# Patient Record
Sex: Male | Born: 1960 | Race: White | Hispanic: No | Marital: Married | Smoking: Current every day smoker
Health system: Southern US, Community
[De-identification: ages and names within clinical notes are randomized; demographics above are authoritative.]

## PROBLEM LIST (undated history)

## (undated) DIAGNOSIS — R569 Unspecified convulsions: Secondary | ICD-10-CM

---

## 2007-03-18 ENCOUNTER — Ambulatory Visit: Payer: Self-pay | Admitting: Family Medicine

## 2010-09-19 ENCOUNTER — Ambulatory Visit: Payer: Self-pay | Admitting: Internal Medicine

## 2010-10-01 ENCOUNTER — Ambulatory Visit: Payer: Self-pay | Admitting: Specialist

## 2010-10-06 ENCOUNTER — Ambulatory Visit: Payer: Self-pay | Admitting: Specialist

## 2010-12-17 ENCOUNTER — Ambulatory Visit: Payer: Self-pay | Admitting: Specialist

## 2011-02-22 ENCOUNTER — Emergency Department: Payer: Self-pay | Admitting: Emergency Medicine

## 2011-02-22 LAB — CBC WITH DIFFERENTIAL/PLATELET
Eosinophil %: 4.9 %
Lymphocyte #: 1.6 10*3/uL (ref 1.0–3.6)
Lymphocyte %: 14.1 %
MCV: 99 fL (ref 80–100)
Monocyte #: 0.6 10*3/uL (ref 0.0–0.7)
Monocyte %: 5.6 %
Neutrophil #: 8.3 10*3/uL — ABNORMAL HIGH (ref 1.4–6.5)
Neutrophil %: 75.2 %
Platelet: 228 10*3/uL (ref 150–440)
RBC: 3.87 10*6/uL — ABNORMAL LOW (ref 4.40–5.90)
WBC: 11.1 10*3/uL — ABNORMAL HIGH (ref 3.8–10.6)

## 2011-02-22 LAB — COMPREHENSIVE METABOLIC PANEL
Albumin: 3.9 g/dL (ref 3.4–5.0)
Anion Gap: 14 (ref 7–16)
BUN: 11 mg/dL (ref 7–18)
Bilirubin,Total: 0.2 mg/dL (ref 0.2–1.0)
Chloride: 105 mmol/L (ref 98–107)
Co2: 26 mmol/L (ref 21–32)
Creatinine: 0.93 mg/dL (ref 0.60–1.30)
EGFR (African American): >60
Osmolality: 291 (ref 275–301)
Potassium: 3.2 mmol/L — ABNORMAL LOW (ref 3.5–5.1)
SGPT (ALT): 25 U/L
Total Protein: 7.3 g/dL (ref 6.4–8.2)

## 2011-02-22 LAB — PHENYTOIN LEVEL, TOTAL: Dilantin: 23.1 ug/mL — ABNORMAL HIGH (ref 10.0–20.0)

## 2011-02-28 ENCOUNTER — Ambulatory Visit: Payer: Self-pay | Admitting: Neurology

## 2015-03-30 ENCOUNTER — Emergency Department: Payer: 59

## 2015-03-30 ENCOUNTER — Encounter: Payer: Self-pay | Admitting: Emergency Medicine

## 2015-03-30 ENCOUNTER — Emergency Department
Admission: EM | Admit: 2015-03-30 | Discharge: 2015-03-31 | Disposition: A | Discharge status: Home / Self Care | Payer: 59 | Attending: Student | Admitting: Student

## 2015-03-30 DIAGNOSIS — Z79899 Other long term (current) drug therapy: Secondary | ICD-10-CM | POA: Diagnosis not present

## 2015-03-30 DIAGNOSIS — X58XXXA Exposure to other specified factors, initial encounter: Secondary | ICD-10-CM | POA: Insufficient documentation

## 2015-03-30 DIAGNOSIS — S20319A Abrasion of unspecified front wall of thorax, initial encounter: Secondary | ICD-10-CM | POA: Insufficient documentation

## 2015-03-30 DIAGNOSIS — G40909 Epilepsy, unspecified, not intractable, without status epilepticus: Secondary | ICD-10-CM | POA: Diagnosis not present

## 2015-03-30 DIAGNOSIS — S0990XA Unspecified injury of head, initial encounter: Secondary | ICD-10-CM | POA: Diagnosis not present

## 2015-03-30 DIAGNOSIS — F17210 Nicotine dependence, cigarettes, uncomplicated: Secondary | ICD-10-CM | POA: Insufficient documentation

## 2015-03-30 DIAGNOSIS — Y9389 Activity, other specified: Secondary | ICD-10-CM | POA: Insufficient documentation

## 2015-03-30 DIAGNOSIS — Y9289 Other specified places as the place of occurrence of the external cause: Secondary | ICD-10-CM | POA: Insufficient documentation

## 2015-03-30 DIAGNOSIS — Y998 Other external cause status: Secondary | ICD-10-CM | POA: Insufficient documentation

## 2015-03-30 DIAGNOSIS — S0081XA Abrasion of other part of head, initial encounter: Secondary | ICD-10-CM | POA: Diagnosis not present

## 2015-03-30 DIAGNOSIS — R569 Unspecified convulsions: Secondary | ICD-10-CM | POA: Diagnosis present

## 2015-03-30 DIAGNOSIS — S3210XA Unspecified fracture of sacrum, initial encounter for closed fracture: Secondary | ICD-10-CM

## 2015-03-30 DIAGNOSIS — S40819A Abrasion of unspecified upper arm, initial encounter: Secondary | ICD-10-CM | POA: Insufficient documentation

## 2015-03-30 DIAGNOSIS — Z880 Allergy status to penicillin: Secondary | ICD-10-CM | POA: Insufficient documentation

## 2015-03-30 DIAGNOSIS — S3219XA Other fracture of sacrum, initial encounter for closed fracture: Secondary | ICD-10-CM | POA: Diagnosis not present

## 2015-03-30 DIAGNOSIS — S0083XA Contusion of other part of head, initial encounter: Secondary | ICD-10-CM | POA: Diagnosis not present

## 2015-03-30 HISTORY — DX: Unspecified convulsions: R56.9

## 2015-03-30 LAB — PHENYTOIN LEVEL, TOTAL: PHENYTOIN LVL: 16.5 ug/mL (ref 10.0–20.0)

## 2015-03-30 LAB — GLUCOSE, CAPILLARY: GLUCOSE-CAPILLARY: 135 mg/dL — AB (ref 65–99)

## 2015-03-30 LAB — CBC WITH DIFFERENTIAL/PLATELET
BASOS PCT: 0 %
Basophils Absolute: 0 10*3/uL (ref 0–0.1)
Eosinophils Absolute: 0.3 10*3/uL (ref 0–0.7)
Eosinophils Relative: 3 %
HEMATOCRIT: 40.5 % (ref 40.0–52.0)
HEMOGLOBIN: 13.5 g/dL (ref 13.0–18.0)
LYMPHS ABS: 1.3 10*3/uL (ref 1.0–3.6)
Lymphocytes Relative: 11 %
MCH: 32.9 pg (ref 26.0–34.0)
MCHC: 33.4 g/dL (ref 32.0–36.0)
MCV: 98.6 fL (ref 80.0–100.0)
MONOS PCT: 6 %
Monocytes Absolute: 0.7 10*3/uL (ref 0.2–1.0)
NEUTROS ABS: 9.6 10*3/uL — AB (ref 1.4–6.5)
NEUTROS PCT: 80 %
Platelets: 233 10*3/uL (ref 150–440)
RBC: 4.11 MIL/uL — AB (ref 4.40–5.90)
RDW: 14 % (ref 11.5–14.5)
WBC: 11.9 10*3/uL — AB (ref 3.8–10.6)

## 2015-03-30 LAB — BASIC METABOLIC PANEL
Anion gap: 9 (ref 5–15)
BUN: 15 mg/dL (ref 6–20)
CHLORIDE: 106 mmol/L (ref 101–111)
CO2: 24 mmol/L (ref 22–32)
CREATININE: 1.22 mg/dL (ref 0.61–1.24)
Calcium: 8.7 mg/dL — ABNORMAL LOW (ref 8.9–10.3)
GFR calc non Af Amer: >60 mL/min (ref >60)
Glucose, Bld: 140 mg/dL — ABNORMAL HIGH (ref 65–99)
POTASSIUM: 3.6 mmol/L (ref 3.5–5.1)
SODIUM: 139 mmol/L (ref 135–145)

## 2015-03-30 LAB — ETHANOL

## 2015-03-30 MED ORDER — OXYCODONE HCL 5 MG PO TABS: 5.0000 mg | ORAL_TABLET | Freq: Four times a day (QID) | ORAL | Status: AC | PRN

## 2015-03-30 MED ORDER — SODIUM CHLORIDE 0.9 % IV BOLUS (SEPSIS)
500.0000 mL | Freq: Once | INTRAVENOUS | Status: AC
  Administered 2015-03-30: 500 mL via INTRAVENOUS

## 2015-03-30 NOTE — ED Provider Notes (Signed)
[facility] Emergency Department Provider Note  ____________________________________________  Time seen: Approximately 10:03 PM  I have reviewed the triage vital signs and the nursing notes.   HISTORY  Chief Complaint Seizures  Caveat-history of present illness and review of systems Limited due to the patient's poor recollection of the events which transpired this evening. Information was obtained from EMS on their arrival as well as partly from the patient.  HPI Alejandro Martin is a 55 y.o. male with history of seizure disorder prescribed Dilantin and Lamictal who presents for evaluation via EMS after possible seizure at work this evening. Patient reports that he was at work and the next thing he knew, paramedics were standing over him and he was lying on the ground. Per EMS, they arrived that to find the patient post ictal and nonverbal however that rapidly improved in route to the emergency department. The patient had several abrasions to his arm, face and upper body. The patient reports history of seizures however reports that he has seizures infrequently. He is unsure of what triggers them. He reports compliance with all medications. He denies any recent illness including no cough, sneezing, runny nose, congestion, vomiting, diarrhea, fevers, chills. He denies any chest pain, difficulty breathing or lightheadedness. He denies any drug or alcohol use. He is currently complaining of moderate to severe headache, neck pain and back pain.   Past Medical History  Diagnosis Date  . Seizures (HCC)     There are no active problems to display for this patient.   History reviewed. No pertinent past surgical history.  Current Outpatient Rx  Name  Route  Sig  Dispense  Refill  . DILANTIN 100 MG ER capsule   Oral   Take 300 mg by mouth daily.           Dispense as written.   . LamoTRIgine 250 MG TB24   Oral   Take 1 tablet by mouth 2 (two) times daily.            Allergies Penicillins  History reviewed. No pertinent family history.  Social History Social History  Substance Use Topics  . Smoking status: Current Every Day Smoker    Types: Cigarettes  . Smokeless tobacco: None  . Alcohol Use: No    Review of Systems Constitutional: No fever/chills Eyes: No visual changes. ENT: No sore throat. Cardiovascular: Denies chest pain. Respiratory: Denies shortness of breath. Gastrointestinal: No abdominal pain.  No nausea, no vomiting.  No diarrhea.  No constipation. Genitourinary: Negative for dysuria. Musculoskeletal: Negative for back pain. Skin: Negative for rash. Neurological: Positive for headaches, focal weakness or numbness.  10-point ROS otherwise negative.  ____________________________________________   PHYSICAL EXAM:  VITAL SIGNS: ED Triage Vitals  Enc Vitals Group     BP 03/30/15 2155 119/83 mmHg     Pulse Rate 03/30/15 2155 90     Resp 03/30/15 2155 15     Temp 03/30/15 2155 98.4 F (36.9 C)     Temp Source 03/30/15 2155 Oral     SpO2 03/30/15 2150 98 %     Weight 03/30/15 2155 135 lb (61.236 kg)     Height 03/30/15 2155  (1.778 m)     Head Cir --      Peak Flow -- [facility] 7     Pain Loc --      Pain Edu? --      Excl. in GC? --  Constitutional: Alert and oriented x4. Nontoxic-appearing and in no acute distress. Eyes: Conjunctivae are normal. PERRL. EOMI. Head: Multiple abrasions/contusions to the forehead. Nose: No congestion/rhinnorhea. Mouth/Throat: Mucous membranes are moist.  Oropharynx non-erythematous. Neck: No stridor.  C-collar in place. No cervical spine tenderness to palpation. Cardiovascular: Normal rate, regular rhythm. Grossly normal heart sounds.  Good peripheral circulation. Respiratory: Normal respiratory effort.  No retractions. Lungs CTAB. Gastrointestinal: Soft and nontender. No distention.  No CVA tenderness. Genitourinary: Deferred Musculoskeletal: No  lower extremity tenderness nor edema.  No joint effusions. Tenderness to palpation throughout the midline of the T-spine, lumbar spine as well as the sacrum and coccyx. Neurologic:  Normal speech and language. No gross focal neurologic deficits are appreciated. 5 out of 5 strength in bilateral upper and lower extremities, sensation intact to light touch throughout, cranial nerves II through XII intact. Skin:  Skin is warm, dry and intact. No rash noted. Muscle abrasions associated with the hands, chest, arms. Psychiatric: Mood and affect are normal. Speech and behavior are normal.  ____________________________________________   LABS (all labs ordered are listed, but only abnormal results are displayed)  Labs Reviewed  CBC WITH DIFFERENTIAL/PLATELET - Abnormal; Notable for the following:    WBC 11.9 (*)    RBC 4.11 (*)    Neutro Abs 9.6 (*)    All other components within normal limits  BASIC METABOLIC PANEL - Abnormal; Notable for the following:    Glucose, Bld 140 (*)    Calcium 8.7 (*)    All other components within normal limits  GLUCOSE, CAPILLARY - Abnormal; Notable for the following:    Glucose-Capillary 135 (*)    All other components within normal limits  ETHANOL  PHENYTOIN LEVEL, TOTAL   ____________________________________________  EKG  ED ECG REPORT I, Gayla Doss, the attending physician, personally viewed and interpreted this ECG.   Date: 03/30/2015  EKG Time: 21:54  Rate: 91  Rhythm: normal EKG, normal sinus rhythm  Axis: normal  Intervals:none  ST&T Change: No acute ST elevation.  ____________________________________________  RADIOLOGY  CT head and c-spine MPRESSION: HEAD CT: No acute intracranial abnormalities. No skull fracture. Sinus mucosal thickening throughout all sinus cavities greatest involving the frontal and ethmoid sinuses.  CERVICAL CT: No fracture or acute finding.  Thoracic spine plain films IMPRESSION: No fracture or acute  finding.  Lumbar spine films  IMPRESSION: 1. No definite evidence of acute fracture or subluxation. 2. Mild loss of height at the superior endplates of L2 and L3 are thought to reflect chronic injury, though an acute fracture of L3 cannot be entirely excluded. No significant retropulsion seen.  Sacrum plain films IMPRESSION: Nondisplaced fracture of the distal sacrum. ____________________________________________   PROCEDURES  Procedure(s) performed: None  Critical Care performed: No  ____________________________________________   INITIAL IMPRESSION / ASSESSMENT AND PLAN / ED COURSE  Pertinent labs & imaging results that were available during my care of the patient were reviewed by me and considered in my medical decision making (see chart for details).  Brook Shaul is a 55 y.o. male with history of seizure disorder prescribed Dilantin and Lamictal who presents for evaluation via EMS after possible seizure at work this evening. On arrival to the emergency department, he is awake, alert, oriented, and in no acute distress. Vital signs stable, he is afebrile. He has an intact neurological examination. Plan for screening labs, plain films of the spine as well as CT head and C-spine, we'll check a Dilantin level and observe.  ----------------------------------------- 11:43  PM on 03/30/2015 ----------------------------------------- Patient continues to rest in no acute distress. He has declined any pain medications at this time. CBC notable for mild leukocytosis. BMP unremarkable. Undetectable ethanol. Therapeutic Dilantin level. CT head and C-spine negative for any acute traumatic pathology. Plain films of the spine are notable for non-displaced sacral fracture as well as a question of possible L3 fracture though this could reflect chronic injury, no significant retropulsion. He remains awake and alert. He'll be observed in the emergency department. Care transferred to Dr. Dolores Frame at this  time. ____________________________________________   FINAL CLINICAL IMPRESSION(S) / ED DIAGNOSES  Final diagnoses:  Seizure (HCC)  Sacral fracture, closed, initial encounter      Gayla Doss, MD 03/30/15 [phone]

## 2015-03-30 NOTE — ED Notes (Signed)
Pt arrived by EMS from work with C/O possible seizure. EMS reports pt was found face down at work place with multiple abrasions to neck, face, arm and chest abrasions. Pt has Hx of seizures, reports last was over a year ago. EMS reports on arrival pt was postictal and non verbal. On assessment in ED pt is A/O x4 with complaints of pain in neck and tailbone. Pt states he takes dilantin.

## 2015-03-31 MED ORDER — OXYCODONE-ACETAMINOPHEN 5-325 MG PO TABS
1.0000 | ORAL_TABLET | Freq: Once | ORAL | Status: AC
  Administered 2015-03-31: 1 via ORAL
  Filled 2015-03-31: qty 1

## 2015-03-31 NOTE — ED Provider Notes (Signed)
-----------------------------------------   1:11 AM on 03/31/2015 -----------------------------------------  Patient improved after Percocet. Reviewed imaging results with patient and spouse. Resting in no acute distress. He is comfortable and eager for discharge at this time. Will be discharged on analgesia and follow-up plan per Dr. Inocencio Homes. Strict return precautions given. Patient verbalizes understanding and agrees with plan of care.  Irean Hong, MD 03/31/15 [phone]

## 2015-04-07 ENCOUNTER — Ambulatory Visit
Admission: EM | Admit: 2015-04-07 | Discharge: 2015-04-07 | Disposition: A | Discharge status: Home / Self Care | Payer: 59 | Attending: Family Medicine | Admitting: Family Medicine

## 2015-04-07 ENCOUNTER — Encounter: Payer: Self-pay | Admitting: Emergency Medicine

## 2015-04-07 ENCOUNTER — Ambulatory Visit (INDEPENDENT_AMBULATORY_CARE_PROVIDER_SITE_OTHER): Payer: 59

## 2015-04-07 DIAGNOSIS — S82141A Displaced bicondylar fracture of right tibia, initial encounter for closed fracture: Secondary | ICD-10-CM

## 2015-04-07 NOTE — Discharge Instructions (Signed)
How to Use a Knee Brace °A knee brace is a device that you wear to support your knee, especially if the knee is healing after an injury or surgery. There are several types of knee braces. Some are designed to prevent an injury (prophylactic brace). These are often worn during sports. Others support an injured knee (functional brace) or keep it still while it heals (rehabilitative brace). People with severe arthritis of the knee may benefit from a brace that takes some pressure off the knee (unloader brace). Most knee braces are made from a combination of cloth and metal or plastic.  °You may need to wear a knee brace to: °· Relieve knee pain. °· Help your knee support your weight (improve stability). °· Help you walk farther (improve mobility). °· Prevent injury. °· Support your knee while it heals from surgery or from an injury. °RISKS AND COMPLICATIONS °Generally, knee braces are very safe to wear. However, problems may occur, including: °· Skin irritation that may lead to infection. °· Making your condition worse if you wear the brace in the wrong way. °HOW TO USE A KNEE BRACE °Different braces will have different instructions for use. Your health care provider will tell you or show you: °· How to put on your brace. °· How to adjust the brace. °· When and how often to wear the brace. °· How to remove the brace. °· If you will need any assistive devices in addition to the brace, such as crutches or a cane. °In general, your brace should: °· Have the hinge of the brace line up with the bend of your knee. °· Have straps, hooks, or tapes that fasten snugly around your leg. °· Not feel too tight or too loose.  °HOW TO CARE FOR A KNEE BRACE °· Check your brace often for signs of damage, such as loose connections or attachments. Your knee brace may get damaged or wear out during normal use. °· Wash the fabric parts of your brace with soap and water. °· Read the insert that comes with your brace for other specific care  instructions.  °SEEK MEDICAL CARE IF: °· Your knee brace is too loose or too tight and you cannot adjust it. °· Your knee brace causes skin redness, swelling, bruising, or irritation. °· Your knee brace is not helping. °· Your knee brace is making your knee pain worse. °  °This information is not intended to replace advice given to you by your health care provider. Make sure you discuss any questions you have with your health care provider. °  °Document Released: 04/09/2003 Document Revised: 10/08/2014 Document Reviewed: 05/12/2014 °Elsevier Interactive Patient Education ©2016 [employer] ° °

## 2015-04-07 NOTE — ED Provider Notes (Signed)
CSN: [id number]     Arrival date & time 04/07/15  1218 History   First MD Initiated Contact with Patient 04/07/15 1439     Chief Complaint  Patient presents with  . Knee Pain   (Consider location/radiation/quality/duration/timing/severity/associated sxs/prior Treatment) HPI  This 55 year old gentleman presents with right knee pain and swelling. He review his medical history the patient had a seizure at work on Monday and fell was transported to [facility] where x-rays revealed him to have a fracture of his bottom sacrum. That his knee did not bother him that day but following day is when it began to hurt. Pain feels to be a posterior knee indicates in the popliteal fossa. He has had swelling of the knee but he states that his knees will swell off and on. He has a limp but continues to walk on his legs unassisted.  Past Medical History  Diagnosis Date  . Seizures (HCC)    History reviewed. No pertinent past surgical history. History reviewed. No pertinent family history. Social History  Substance Use Topics  . Smoking status: Current Every Day Smoker    Types: Cigarettes  . Smokeless tobacco: None  . Alcohol Use: No    Review of Systems  Constitutional: Positive for activity change. Negative for fever, chills and fatigue.  Musculoskeletal: Positive for joint swelling, arthralgias and gait problem.  All other systems reviewed and are negative.   Allergies  Penicillins  Home Medications   Prior to Admission medications   Medication Sig Start Date End Date Taking? Authorizing Provider  DILANTIN 100 MG ER capsule Take 300 mg by mouth daily.    Historical Provider, MD  LamoTRIgine 250 MG TB24 Take 1 tablet by mouth 2 (two) times daily.    Historical Provider, MD  oxyCODONE (ROXICODONE) 5 MG immediate release tablet Take 1 tablet (5 mg total) by mouth every 6 (six) hours as needed for moderate pain. Do not drive while taking this medication. 03/30/15   Gayla DossEryka A Gayle, MD    Meds Ordered and Administered this Visit  Medications - No data to display  BP 102/69 mmHg  Pulse 68  Temp(Src) 97 F (36.1 C) (Tympanic)  Resp 16  Ht 5\' 10"  (1.778 m)  Wt 135 lb (61.236 kg)  BMI 19.37 kg/m2  SpO2 99% No data found.   Physical Exam  Constitutional: He is oriented to person, place, and time. He appears well-developed and well-nourished. No distress.  HENT:  Head: Normocephalic and atraumatic.  Eyes: Conjunctivae are normal. Pupils are equal, round, and reactive to light.  Neck: Normal range of motion. Neck supple.  Musculoskeletal: He exhibits edema and tenderness.  Examination of the right knee shows a 3+ effusion. There is limitation of motion due to the effusion. Collateral collateral ligaments are intact to clinical stressing. Does have some discomfort to compression of the popliteal fossa particularly on the medial aspect.  Neurological: He is alert and oriented to person, place, and time.  Skin: Skin is warm and dry. He is not diaphoretic.  Psychiatric: He has a normal mood and affect. His behavior is normal. Judgment and thought content normal.  Nursing note and vitals reviewed.   ED Course  Procedures (including critical care time)  Labs Review Labs Reviewed - No data to display  Imaging Review Dg Knee Complete 4 Views Right  04/07/2015  CLINICAL DATA:  Right knee pain, has history of seizure on Monday EXAM: RIGHT KNEE - COMPLETE 4+ VIEW COMPARISON:  None. FINDINGS: There  is irregularity of the tibial plateau medially suspicious for fracture. No depression of a fracture of the plateau is seen. Also there appear to be loose bodies within the joint space. 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 A moderate size right knee joint effusion is noted. IMPRESSION: 1. Irregularity of the right medial tibial plateau  suspicious for fracture. CT of the right knee with multiplanar reconstruction is recommended. 2. Moderate right knee joint effusion. 3. Suspect small loose bodies within the joint space. 4. Mild tricompartmental degenerative joint disease Electronically Signed   By: Dwyane Dee M.D.   On: 04/07/2015 14:18     Visual Acuity Review  Right Eye Distance:   Left Eye Distance:   Bilateral Distance:    Right Eye Near:   Left Eye Near:    Bilateral Near:         MDM   1. Fracture of posterior aspect of right tibial plateau, closed, initial encounter    I have discussed with the patient and told him the findings of the x-rays for a possible abnormality of the tibial plateau. No depression at the present time. In addition he seems to have several small loose bodies in the joint itself. This may have occurred at the time of his seizure on Monday. I have advised him to keep the weight off of the knee and have encouraged him to use crutches non-weight bearing but the patient declines. He reports he has a "fancy" brace in his car that he will be using until he can be seen by an orthopedic surgeon. I also offered him pain medication but again he declined. He will make an appointment to see an orthopedic surgeon in [place] as soon as possible. A disc of his x-ray was burned and given to him. Lutricia Feil, PA-C 04/07/15 1601

## 2015-04-07 NOTE — ED Notes (Signed)
Patient c/o right knee pain that started on Tuesday.  Patient unsure if he hurt his knee during his seizure on Monday.  Patient states that he did fracture his tailbone on Monday.

## 2015-10-19 ENCOUNTER — Encounter: Payer: Self-pay | Admitting: Emergency Medicine

## 2015-10-19 ENCOUNTER — Emergency Department
Admission: EM | Admit: 2015-10-19 | Discharge: 2015-10-20 | Disposition: A | Discharge status: Home / Self Care | Payer: 59 | Attending: Emergency Medicine | Admitting: Emergency Medicine

## 2015-10-19 DIAGNOSIS — G40909 Epilepsy, unspecified, not intractable, without status epilepticus: Secondary | ICD-10-CM | POA: Diagnosis not present

## 2015-10-19 DIAGNOSIS — R569 Unspecified convulsions: Secondary | ICD-10-CM

## 2015-10-19 DIAGNOSIS — F17210 Nicotine dependence, cigarettes, uncomplicated: Secondary | ICD-10-CM | POA: Diagnosis not present

## 2015-10-19 DIAGNOSIS — R892 Abnormal level of other drugs, medicaments and biological substances in specimens from other organs, systems and tissues: Secondary | ICD-10-CM | POA: Diagnosis not present

## 2015-10-19 DIAGNOSIS — R7889 Finding of other specified substances, not normally found in blood: Secondary | ICD-10-CM

## 2015-10-19 NOTE — ED Triage Notes (Signed)
Pt arrived to the ED via EMS from work [employer] for a seizure. EMS reports that the Pt's coworkers reported a seizure lasting 5 min and the Pt being combative. EMS started an IV 18G on the right [facility] and gave 2 of versed. Pt is AOX4 but drowsy upon arrival to the hospital.

## 2015-10-20 ENCOUNTER — Emergency Department: Payer: 59

## 2015-10-20 LAB — BASIC METABOLIC PANEL
ANION GAP: 14 (ref 5–15)
BUN: 16 mg/dL (ref 6–20)
CHLORIDE: 106 mmol/L (ref 101–111)
CO2: 19 mmol/L — AB (ref 22–32)
CREATININE: 1.21 mg/dL (ref 0.61–1.24)
Calcium: 9.1 mg/dL (ref 8.9–10.3)
GFR calc non Af Amer: >60 mL/min (ref >60)
Glucose, Bld: 147 mg/dL — ABNORMAL HIGH (ref 65–99)
POTASSIUM: 3.1 mmol/L — AB (ref 3.5–5.1)
SODIUM: 139 mmol/L (ref 135–145)

## 2015-10-20 LAB — CBC
HEMATOCRIT: 41.2 % (ref 40.0–52.0)
HEMOGLOBIN: 14.4 g/dL (ref 13.0–18.0)
MCH: 34.3 pg — ABNORMAL HIGH (ref 26.0–34.0)
MCHC: 35 g/dL (ref 32.0–36.0)
MCV: 98 fL (ref 80.0–100.0)
Platelets: 254 10*3/uL (ref 150–440)
RBC: 4.21 MIL/uL — AB (ref 4.40–5.90)
RDW: 14.3 % (ref 11.5–14.5)
WBC: 9.6 10*3/uL (ref 3.8–10.6)

## 2015-10-20 LAB — PHENYTOIN LEVEL, TOTAL: PHENYTOIN LVL: 28.8 ug/mL — AB (ref 10.0–20.0)

## 2015-10-20 NOTE — Discharge Instructions (Signed)
Please hold dilantin morning dose and follow up with your neurologist.

## 2015-10-20 NOTE — ED Provider Notes (Signed)
[facility] Emergency Department Provider Note   ____________________________________________   First MD Initiated Contact with Patient 10/19/15 2349     (approximate)  I have reviewed the triage vital signs and the nursing notes.   HISTORY  Chief Complaint Seizures    HPI Alejandro Martin is a 55 y.o. male who comes into the hospital today with a seizure. The patient does have a history of seizures but reports his last seizure was in January. The patient takes Dilantin daily and reports that he has not missed any doses. He reports that he may be has one seizure a year. He is unsure exactly what happened with this seizure. He was at work when it happened and came to after the ambulance was there. He reports that he did not get an aura prior to the onset of the seizure. He does have some right knee pain but is unsure if he hit his head. He reports that he sees Dr. Clelia Croft for neurology and was last seen 2 weeks ago. He was placed on an antidepressant but does not Inc. he had any changes to his Dilantin. The patient denies any chest pain or blurred vision.He is here for evaluation   Past Medical History:  Diagnosis Date  . Seizures (HCC)     There are no active problems to display for this patient.   History reviewed. No pertinent surgical history.  Prior to Admission medications   Medication Sig Start Date End Date Taking? Authorizing Provider  DILANTIN 100 MG ER capsule Take 300 mg by mouth daily.    Historical Provider, MD  LamoTRIgine 250 MG TB24 Take 1 tablet by mouth 2 (two) times daily.    Historical Provider, MD  oxyCODONE (ROXICODONE) 5 MG immediate release tablet Take 1 tablet (5 mg total) by mouth every 6 (six) hours as needed for moderate pain. Do not drive while taking this medication. 03/30/15   Gayla Doss, MD    Allergies Penicillins  History reviewed. No pertinent family history.  Social History Social History  Substance Use Topics  .  Smoking status: Current Every Day Smoker    Types: Cigarettes  . Smokeless tobacco: Never Used  . Alcohol use No    Review of Systems Constitutional: No fever/chills Eyes: No visual changes. ENT: No sore throat. Cardiovascular: Denies chest pain. Respiratory: Denies shortness of breath. Gastrointestinal: No abdominal pain.  No nausea, no vomiting.  No diarrhea.  No constipation. Genitourinary: Negative for dysuria. Musculoskeletal: Right knee pain Skin: Negative for rash. Neurological: Seizure  10-point ROS otherwise negative.  ____________________________________________   PHYSICAL EXAM:  VITAL SIGNS: ED Triage Vitals  Enc Vitals Group     BP 10/19/15 2236 112/81     Pulse Rate 10/19/15 2236 90     Resp 10/19/15 2236 16     Temp 10/19/15 2236 98.1 F (36.7 C)     Temp Source 10/19/15 2236 Oral     SpO2 10/19/15 2236 94 %     Weight 10/19/15 2237 133 lb (60.3 kg)     Height 10/19/15 2237 5\' 9"  (1.753 m)     Head Circumference --      Peak Flow --      Pain Score 10/19/15 2237 5     Pain Loc --      Pain Edu? --      Excl. in GC? --     Constitutional: Alert and oriented. Well appearing and in no acute distress. Eyes: Conjunctivae are  normal. PERRL. EOMI. Head: Atraumatic. Nose: No congestion/rhinnorhea. Mouth/Throat: Mucous membranes are moist.  Oropharynx non-erythematous. Neck: No cervical spine tenderness to palpation. Cardiovascular: Normal rate, regular rhythm. Grossly normal heart sounds.  Good peripheral circulation. Respiratory: Normal respiratory effort.  No retractions. Lungs CTAB. Gastrointestinal: Soft and nontender. No distention. Positive bowel sounds Musculoskeletal: No lower extremity tenderness nor edema.   Neurologic:  Normal speech and language. Cranial nerves II through XII are grossly intact with no focal motor or neuro deficits Skin:  Skin is warm, dry and intact.  Psychiatric: Mood and affect are normal.    ____________________________________________   LABS (all labs ordered are listed, but only abnormal results are displayed)  Labs Reviewed  CBC - Abnormal; Notable for the following:       Result Value   RBC 4.21 (*)    MCH 34.3 (*)    All other components within normal limits  BASIC METABOLIC PANEL - Abnormal; Notable for the following:    Potassium 3.1 (*)    CO2 19 (*)    Glucose, Bld 147 (*)    All other components within normal limits  PHENYTOIN LEVEL, TOTAL - Abnormal; Notable for the following:    Phenytoin Lvl 28.8 (*)    All other components within normal limits  URINALYSIS COMPLETEWITH MICROSCOPIC ([facility] ONLY)   ____________________________________________  EKG  ED ECG REPORT I, Rebecka ApleyWebster,  Kelby Adell P, the attending physician, personally viewed and interpreted this ECG.   Date: 10/19/2015  EKG Time: 2234  Rate: 91  Rhythm: normal sinus rhythm  Axis: normal  Intervals:none  ST&T Change: ST depressions in leads 2, 3, aVF, V5, V6  ____________________________________________  RADIOLOGY  CT head CT cervical spine Right knee xray ____________________________________________   PROCEDURES  Procedure(s) performed: None  Procedures  Critical Care performed: No  ____________________________________________   INITIAL IMPRESSION / ASSESSMENT AND PLAN / ED COURSE  Pertinent labs & imaging results that were available during my care of the patient were reviewed by me and considered in my medical decision making (see chart for details).  This is a 55 year old male who comes into the hospital today with a seizure. The patient reports he has not had a seizure and multiple months. He does not remember anything that happened. He reports he has been taking his medications regularly. Since the patient is unsure if he hit his head I will do a CT of his head and cervical spine. We'll check some blood work to include a Dilantin level. I will then reassess the patient  and determine if he needs a load of fosphenytoin. I will reassess the patient once I received the results of his imaging and blood work.  Clinical Course  Value Comment By Time  CT Head Wo Contrast 1. No evidence of traumatic intracranial injury or fracture. 2. No evidence of fracture or subluxation along the cervical spine. 3. Minimal degenerative change at the lower cervical spine. 4. Emphysematous change noted at the lung apices.   Rebecka ApleyAllison P Justyce Baby, MD 09/19 0150  DG Knee Complete 4 Views Right Chronic changes in the right knee as discussed. Mild tricompartment degenerative changes with possible loose bodies. No significant effusion.   Rebecka ApleyAllison P Solei Wubben, MD 09/19 0151   The patient's Dilantin level is mildly elevated at 28.8. There is a possibility that his seizure could've been due to the mild Dilantin toxicity. The remainder of his blood work as well as his imaging is unremarkable. I feel that I can discharge the patient home. He's  had no further seizures here. I will encourage the patient to follow-up with Dr. Bessemer Callas and to hold his Dilantin dose in the morning. Otherwise the patient has no further complaints or concerns and he will be discharged.  ____________________________________________   FINAL CLINICAL IMPRESSION(S) / ED DIAGNOSES  Final diagnoses:  Seizure (HCC)  Elevated Dilantin level      NEW MEDICATIONS STARTED DURING THIS VISIT:  New Prescriptions   No medications on file     Note:  This document was prepared using Dragon voice recognition software and may include unintentional dictation errors.    Rebecka Apley, MD 10/20/15 0157

## 2017-07-10 ENCOUNTER — Encounter: Payer: Self-pay | Admitting: Emergency Medicine

## 2017-07-10 ENCOUNTER — Emergency Department: Payer: 59

## 2017-07-10 ENCOUNTER — Emergency Department
Admission: EM | Admit: 2017-07-10 | Discharge: 2017-07-11 | Disposition: A | Discharge status: Home / Self Care | Payer: 59 | Attending: Emergency Medicine | Admitting: Emergency Medicine

## 2017-07-10 DIAGNOSIS — E876 Hypokalemia: Secondary | ICD-10-CM

## 2017-07-10 DIAGNOSIS — R569 Unspecified convulsions: Secondary | ICD-10-CM | POA: Insufficient documentation

## 2017-07-10 DIAGNOSIS — F17210 Nicotine dependence, cigarettes, uncomplicated: Secondary | ICD-10-CM | POA: Insufficient documentation

## 2017-07-10 DIAGNOSIS — Z79899 Other long term (current) drug therapy: Secondary | ICD-10-CM | POA: Diagnosis not present

## 2017-07-10 MED ORDER — SODIUM CHLORIDE 0.9 % IV BOLUS
1000.0000 mL | Freq: Once | INTRAVENOUS | Status: AC
  Administered 2017-07-11: 1000 mL via INTRAVENOUS

## 2017-07-10 NOTE — ED Provider Notes (Signed)
[facility] Emergency Department Provider Note   ____________________________________________   First MD Initiated Contact with Patient 07/10/17 2348     (approximate)  I have reviewed the triage vital signs and the nursing notes.   HISTORY  Chief Complaint Seizure   HPI Alejandro Martin is a 57 y.o. male with a history of seizure disorder brought to the ED from work via EMS for seizure-like activity.  Patient was found on the floor of his workplace with active seizure.  Received 2 mg IV Versed per EMS with resolution of seizure.  Arrives to the ED recovering from a postictal state.  States he has not been taking his Dilantin regularly but take it tonight.  Has not yet taken his nighttime Depakote pill which is still in his pocket.  Denies recent fever, chills, headache, neck pain, chest pain, shortness of breath, abdominal pain, nausea or vomiting.  Denies recent travel.   Past Medical History:  Diagnosis Date  . Seizures (HCC)     There are no active problems to display for this patient.   History reviewed. No pertinent surgical history.  Prior to Admission medications   Medication Sig Start Date End Date Taking? Authorizing Provider  DILANTIN 100 MG ER capsule Take 300 mg by mouth at bedtime.    Yes [provider]  divalproex (DEPAKOTE ER) 250 MG 24 hr tablet Take 250 mg by mouth 2 (two) times daily.   Yes [provider]  oxyCODONE (ROXICODONE) 5 MG immediate release tablet Take 1 tablet (5 mg total) by mouth every 6 (six) hours as needed for moderate pain. Do not drive while taking this medication. Patient not taking: Reported on 07/11/2017 03/30/15   Gayla Doss, MD    Allergies Penicillins  History reviewed. No pertinent family history.  Social History Social History   Tobacco Use  . Smoking status: Current Every Day Smoker    Types: Cigarettes  . Smokeless tobacco: Never Used  Substance Use Topics  . Alcohol use: No    . Drug use: Not on file    Review of Systems  Constitutional: No fever/chills Eyes: No visual changes. ENT: No sore throat. Cardiovascular: Denies chest pain. Respiratory: Denies shortness of breath. Gastrointestinal: No abdominal pain.  No nausea, no vomiting.  No diarrhea.  No constipation. Genitourinary: Negative for dysuria. Musculoskeletal: Negative for back pain. Skin: Negative for rash. Neurological: Positive for seizure.  Negative for headaches, focal weakness or numbness.   ____________________________________________   PHYSICAL EXAM:  VITAL SIGNS: ED Triage Vitals  Enc Vitals Group     BP      Pulse      Resp      Temp      Temp src      SpO2      Weight      Height      Head Circumference      Peak Flow      Pain Score      Pain Loc      Pain Edu?      Excl. in GC?     Constitutional: Alert and oriented.  Disheveled appearing and in mild acute distress. Eyes: Conjunctivae are normal. PERRL. EOMI. Head: Atraumatic. Nose: No congestion/rhinnorhea. Mouth/Throat: Mucous membranes are moist.  Bit tongue. Neck: No stridor.  No cervical spine tenderness to palpation.  Supple neck without meningismus. Cardiovascular: Normal rate, regular rhythm. Grossly normal heart sounds.  Good peripheral circulation. Respiratory: Normal respiratory effort.  No  retractions. Lungs CTAB. Gastrointestinal: Soft and nontender. No distention. No abdominal bruits. No CVA tenderness. Genitourinary: No urinary incontinence. Musculoskeletal: No lower extremity tenderness nor edema.  No joint effusions. Neurologic:  Normal speech and language. No gross focal neurologic deficits are appreciated.  Skin:  Skin is warm, dry and intact. No rash noted.  No petechiae. Psychiatric: Mood and affect are normal. Speech and behavior are normal.  ____________________________________________   LABS (all labs ordered are listed, but only abnormal results are displayed)  Labs Reviewed  CBC  WITH DIFFERENTIAL/PLATELET - Abnormal; Notable for the following components:      Result Value   WBC 10.8 (*)    RBC 4.32 (*)    MCH 34.3 (*)    Neutro Abs 7.4 (*)    All other components within normal limits  COMPREHENSIVE METABOLIC PANEL - Abnormal; Notable for the following components:   Potassium 3.2 (*)    CO2 18 (*)    Glucose, Bld 139 (*)    Calcium 8.8 (*)    All other components within normal limits  PHENYTOIN LEVEL, TOTAL - Abnormal; Notable for the following components:   Phenytoin Lvl 6.3 (*)    All other components within normal limits  VALPROIC ACID LEVEL - Abnormal; Notable for the following components:   Valproic Acid Lvl 12 (*)    All other components within normal limits  ETHANOL  URINE DRUG SCREEN, QUALITATIVE ([facility] ONLY)   ____________________________________________  EKG  ED ECG REPORT I, Clemma Johnsen J, the attending physician, personally viewed and interpreted this ECG.   Date: 07/11/2017  EKG Time: 2350  Rate: 95  Rhythm: normal EKG, normal sinus rhythm  Axis: Normal  Intervals:none  ST&T Change: Nonspecific  ____________________________________________  RADIOLOGY  ED MD interpretation: No acute ICH, no acute cardiopulmonary process  Official radiology report(s): Ct Head Wo Contrast  Result Date: 07/11/2017 CLINICAL DATA:  Witnessed seizure at work. EXAM: CT HEAD WITHOUT CONTRAST TECHNIQUE: Contiguous axial images were obtained from the base of the skull through the vertex without intravenous contrast. COMPARISON:  10/20/2015 FINDINGS: Brain: No evidence of acute infarction, hemorrhage, hydrocephalus, extra-axial collection or mass lesion/mass effect. Vascular: No hyperdense vessel or unexpected calcification. Skull: Normal. Negative for fracture or focal lesion. Sinuses/Orbits: Moderate ethmoid and sent sphenoid sinus mucosal thickening. Clear mastoids. Intact orbits and globes. Other: None IMPRESSION: No acute intracranial abnormality. Ethmoid and  sphenoid sinus mucosal thickening. Electronically Signed   By: Tollie Ethavid  Kwon M.D.   On: 07/11/2017 01:44   Dg Chest Port 1 View  Result Date: 07/11/2017 CLINICAL DATA:  Initial evaluation for acute seizure. EXAM: PORTABLE CHEST 1 VIEW COMPARISON:  None available. FINDINGS: Cardiac and mediastinal silhouettes are within normal limits. Lungs mildly hyperinflated with findings suggestive of emphysema. No focal infiltrates. No pulmonary edema or pleural effusion. No pneumothorax. No acute osseus abnormality. Diffuse osteopenia. Advanced degenerative changes with probable remotely healed trauma about the right shoulder. IMPRESSION: 1. No active cardiopulmonary disease. 2. Probable underlying COPD. Electronically Signed   By: Rise MuBenjamin  McClintock M.D.   On: 07/11/2017 00:45    ____________________________________________   PROCEDURES  Procedure(s) performed: None  Procedures  Critical Care performed: No  ____________________________________________   INITIAL IMPRESSION / ASSESSMENT AND PLAN / ED COURSE  As part of my medical decision making, I reviewed the following data within the electronic MEDICAL RECORD NUMBER Nursing notes reviewed and incorporated, Labs reviewed, EKG interpreted, Old chart reviewed, Radiograph reviewed and Notes from prior ED visits   57 year old male with  seizure disorder found to have a seizure at his workplace.  Recent medicine nonadherence.  Differential diagnosis includes but is not limited to seizure, infectious, metabolic, ICH etiologies, etc.  Will obtain screening lab work and urinalysis; check Dilantin and Depakote levels.  Will send for CT head to evaluate for intracranial hemorrhage.  Will reassess.   Clinical Course as of Jul 11 244  Tue Jul 11, 2017  0245 IV Cerebyx completed.  Patient resting no acute distress.  Updated him of all test results.  Patient states he still has Dilantin and Depakote at home and does not require prescription.  Strict return  precautions given.  Patient verbalizes understanding agrees with plan of care.   [JS]    Clinical Course User Index [JS] Irean Hong, MD     ____________________________________________   FINAL CLINICAL IMPRESSION(S) / ED DIAGNOSES  Final diagnoses:  Seizure [facility])  Hypokalemia     ED Discharge Orders    None       Note:  This document was prepared using Dragon voice recognition software and may include unintentional dictation errors.    Irean Hong, MD 07/11/17 [phone]

## 2017-07-10 NOTE — ED Triage Notes (Signed)
Pt arrived to ED via EMS from workplace where pt had witnessed seizure, unknown length of time. 2mg  Versed given in route. Pt is A&O x4 on arrival, drowsy but able to answer all questions. MD at bedside.

## 2017-07-11 ENCOUNTER — Emergency Department: Payer: 59

## 2017-07-11 LAB — CBC WITH DIFFERENTIAL/PLATELET
Basophils Absolute: 0.1 10*3/uL (ref 0–0.1)
Basophils Relative: 1 %
Eosinophils Absolute: 0.5 10*3/uL (ref 0–0.7)
Eosinophils Relative: 4 %
HEMATOCRIT: 42.7 % (ref 40.0–52.0)
HEMOGLOBIN: 14.8 g/dL (ref 13.0–18.0)
LYMPHS ABS: 2.1 10*3/uL (ref 1.0–3.6)
LYMPHS PCT: 20 %
MCH: 34.3 pg — AB (ref 26.0–34.0)
MCHC: 34.7 g/dL (ref 32.0–36.0)
MCV: 98.7 fL (ref 80.0–100.0)
MONOS PCT: 6 %
Monocytes Absolute: 0.7 10*3/uL (ref 0.2–1.0)
NEUTROS ABS: 7.4 10*3/uL — AB (ref 1.4–6.5)
NEUTROS PCT: 69 %
Platelets: 234 10*3/uL (ref 150–440)
RBC: 4.32 MIL/uL — ABNORMAL LOW (ref 4.40–5.90)
RDW: 14.3 % (ref 11.5–14.5)
WBC: 10.8 10*3/uL — AB (ref 3.8–10.6)

## 2017-07-11 LAB — COMPREHENSIVE METABOLIC PANEL
ALK PHOS: 76 U/L (ref 38–126)
ALT: 18 U/L (ref 17–63)
ANION GAP: 14 (ref 5–15)
AST: 37 U/L (ref 15–41)
Albumin: 4.5 g/dL (ref 3.5–5.0)
BUN: 18 mg/dL (ref 6–20)
CALCIUM: 8.8 mg/dL — AB (ref 8.9–10.3)
CO2: 18 mmol/L — AB (ref 22–32)
CREATININE: 0.91 mg/dL (ref 0.61–1.24)
Chloride: 106 mmol/L (ref 101–111)
GFR calc non Af Amer: >60 mL/min (ref >60)
GLUCOSE: 139 mg/dL — AB (ref 65–99)
Potassium: 3.2 mmol/L — ABNORMAL LOW (ref 3.5–5.1)
Sodium: 138 mmol/L (ref 135–145)
Total Bilirubin: 0.5 mg/dL (ref 0.3–1.2)
Total Protein: 7.5 g/dL (ref 6.5–8.1)

## 2017-07-11 LAB — VALPROIC ACID LEVEL: Valproic Acid Lvl: 12 ug/mL — ABNORMAL LOW (ref 50.0–100.0)

## 2017-07-11 LAB — ETHANOL

## 2017-07-11 LAB — PHENYTOIN LEVEL, TOTAL: Phenytoin Lvl: 6.3 ug/mL — ABNORMAL LOW (ref 10.0–20.0)

## 2017-07-11 MED ORDER — SODIUM CHLORIDE 0.9 % IV SOLN: 1000.0000 mg | Freq: Once | INTRAVENOUS | Status: DC

## 2017-07-11 MED ORDER — POTASSIUM CHLORIDE CRYS ER 20 MEQ PO TBCR
40.0000 meq | EXTENDED_RELEASE_TABLET | Freq: Once | ORAL | Status: AC
  Administered 2017-07-11: 40 meq via ORAL
  Filled 2017-07-11: qty 2

## 2017-07-11 MED ORDER — SODIUM CHLORIDE 0.9 % IV SOLN
1000.0000 mg | Freq: Once | INTRAVENOUS | Status: AC
  Administered 2017-07-11: 1000 mg via INTRAVENOUS
  Filled 2017-07-11: qty 20

## 2017-07-11 NOTE — Discharge Instructions (Signed)
Please take your seizure medicines every day as directed by your doctor.  Do not drive or operate heavy machinery until cleared by your doctor.  Return to the ER for recurrent or worsening symptoms, persistent vomiting, difficulty breathing or other concerns.

## 2018-09-06 IMAGING — CT CT HEAD W/O CM
3 series · 16 of 47 positions shown, 19 images · non-contrast
Comparison: 10/20/2015

CLINICAL DATA: Witnessed seizure at work.

EXAM:
CT HEAD WITHOUT CONTRAST
TECHNIQUE: Contiguous axial images were obtained from the base of the skull
through the vertex without intravenous contrast.

[Series 3: head wo · axial · 0.43mm/px · z∈[+456,+581]mm · 10 of 30 slices shown, 13 images]
[im 3/30  brain]
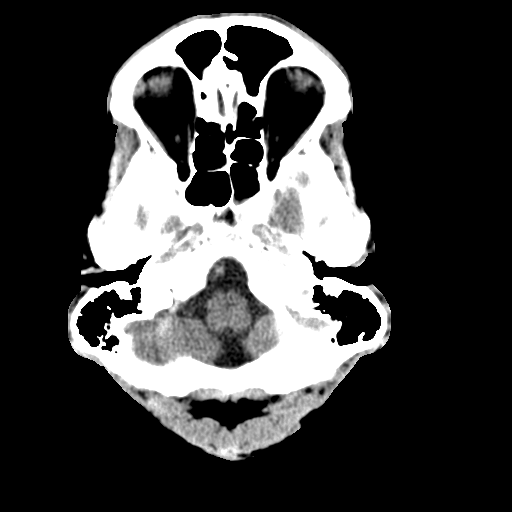
[im 3/30  bone]
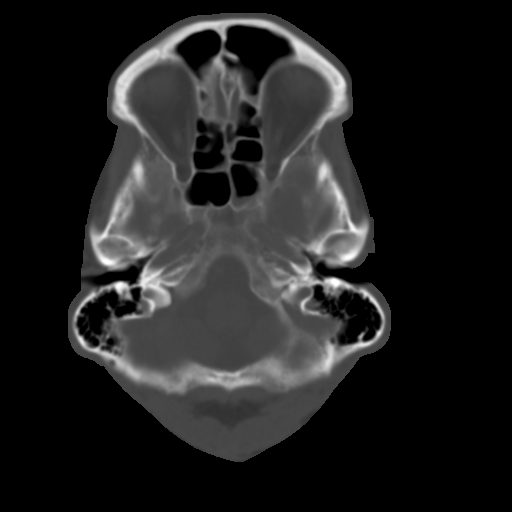
[im 6/30  brain]
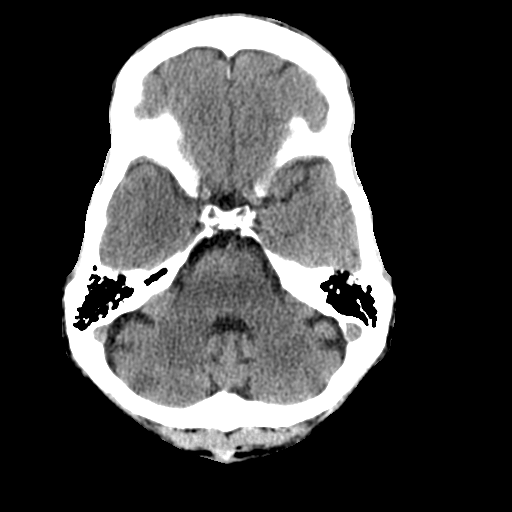
[im 9/30  brain]
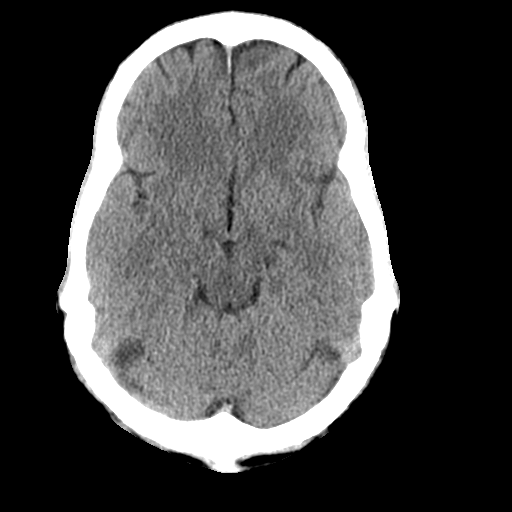
[im 11/30  brain]
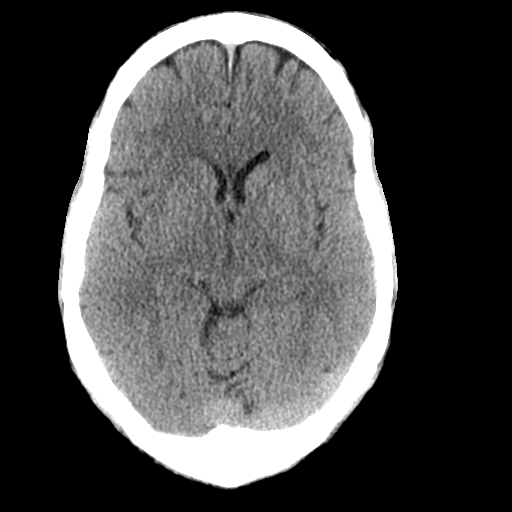
[im 14/30  brain]
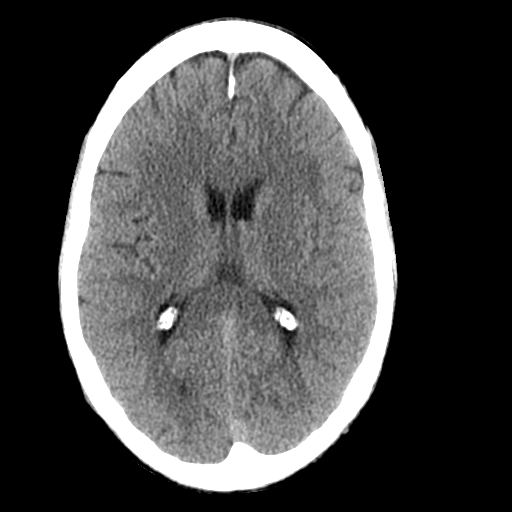
[im 14/30  bone]
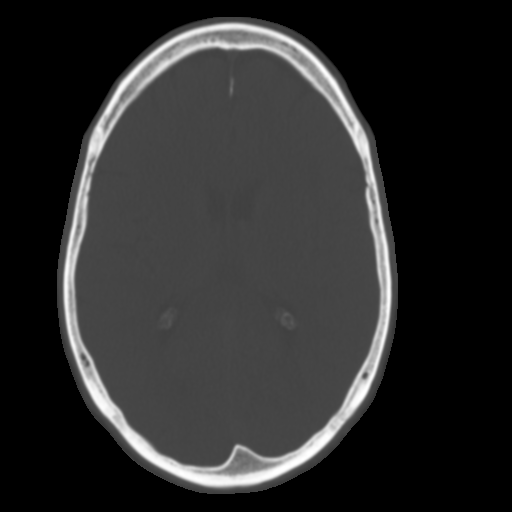
[im 17/30  brain]
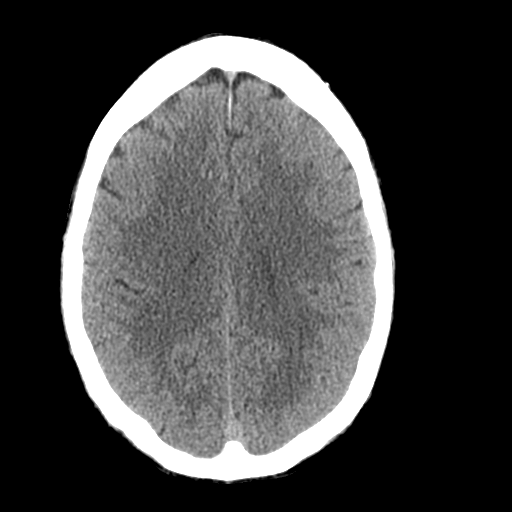
[im 20/30  brain]
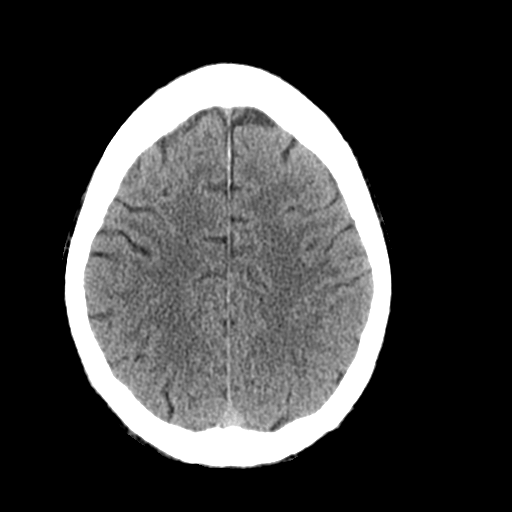
[im 23/30  brain]
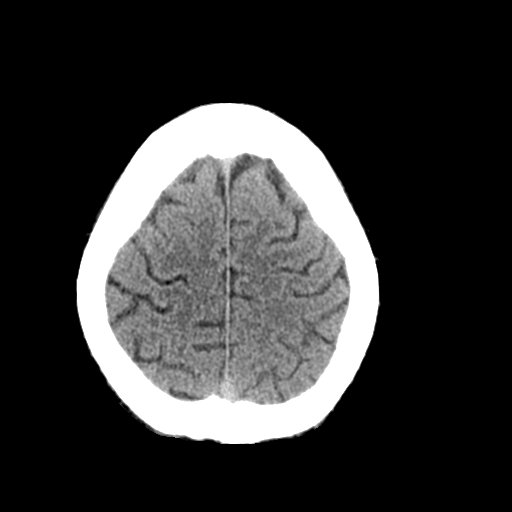
[im 25/30  brain]
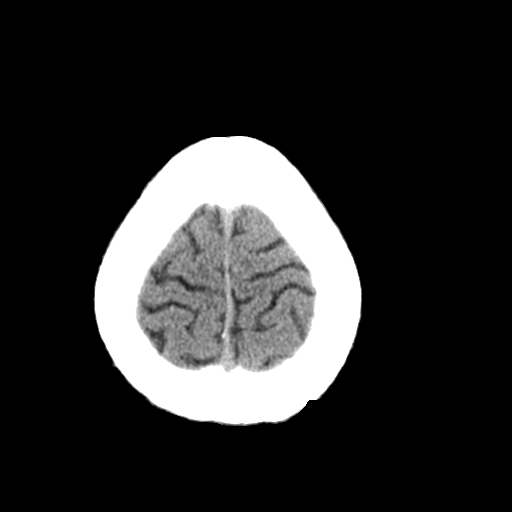
[im 25/30  bone]
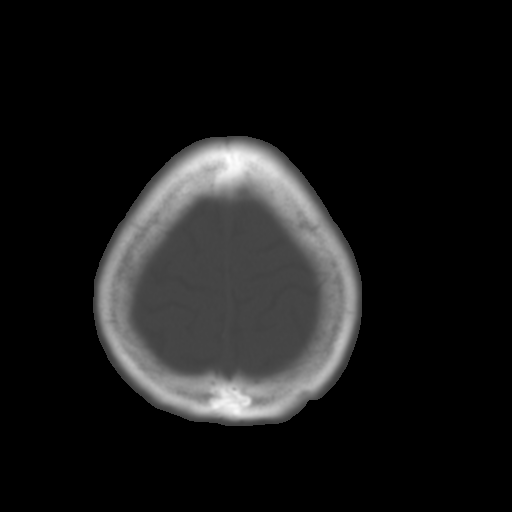
[im 28/30  brain]
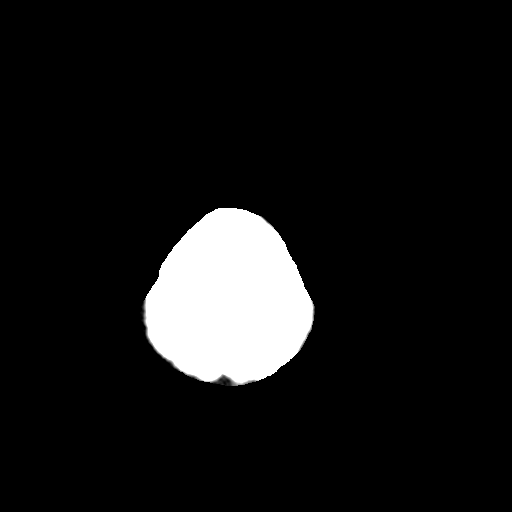

[Series 4: coronal soft tissue · coronal · 0.29mm/px · 3 of 70 slices shown]
[im 24/70  brain]
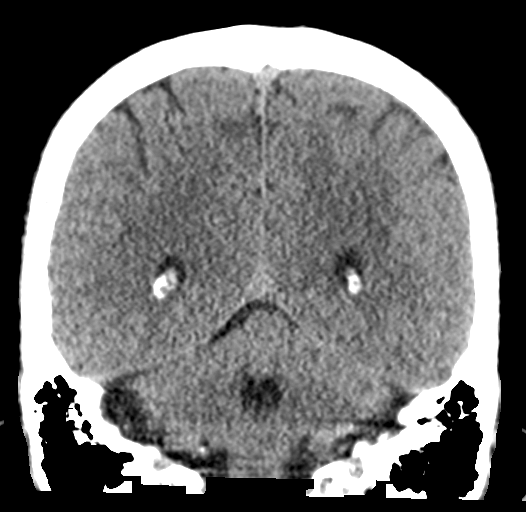
[im 31/70  brain]
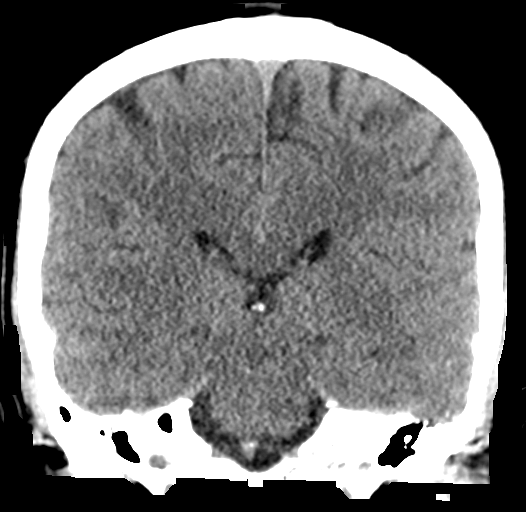
[im 39/70  brain]
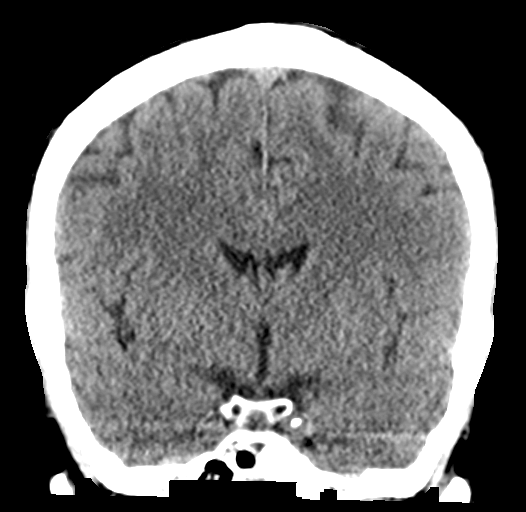

[Series 5: sagittal soft tissue · sagittal · 0.29mm/px · 3 of 52 slices shown]
[im 18/52  brain]
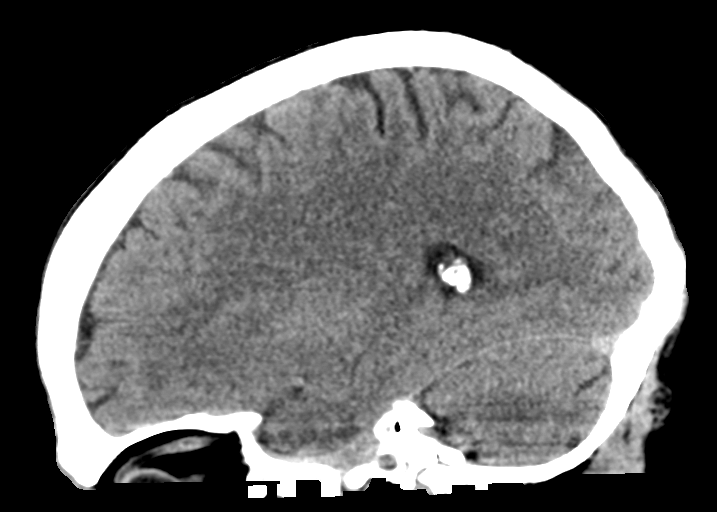
[im 26/52  brain]
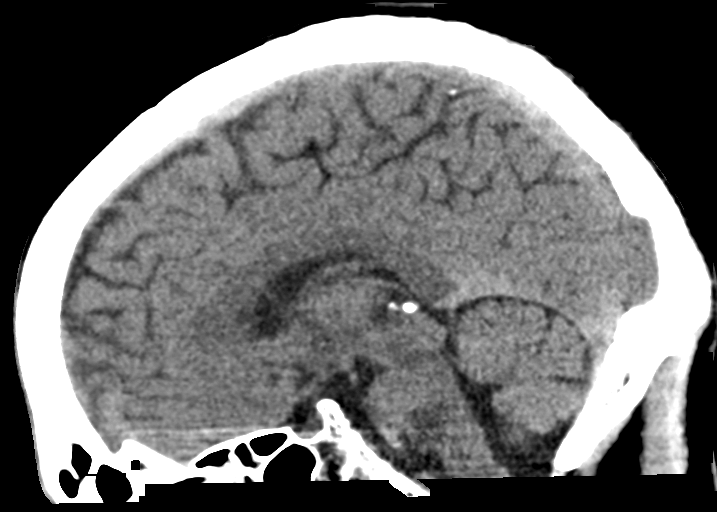
[im 35/52  brain]
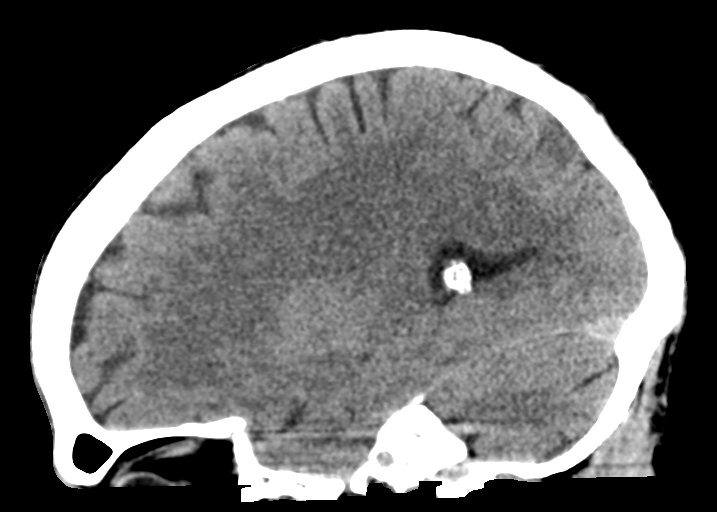

[16 of 47 positions shown; findings below may reference images not displayed]

FINDINGS: Brain: No evidence of acute infarction, hemorrhage, hydrocephalus,
extra-axial collection or mass lesion/mass effect.

Vascular: No hyperdense vessel or unexpected calcification.

Skull: Normal. Negative for fracture or focal lesion.

Sinuses/Orbits: Moderate ethmoid and sent sphenoid sinus mucosal
thickening. Clear mastoids. Intact orbits and globes.

Other: None
IMPRESSION: No acute intracranial abnormality. Ethmoid and sphenoid sinus
mucosal thickening.

## 2019-04-29 ENCOUNTER — Other Ambulatory Visit: Payer: Self-pay

## 2019-04-29 ENCOUNTER — Ambulatory Visit: Payer: 59 | Attending: Internal Medicine

## 2019-04-29 DIAGNOSIS — Z23 Encounter for immunization: Secondary | ICD-10-CM

## 2019-04-29 NOTE — Progress Notes (Signed)
   Covid-19 Vaccination Clinic  Name:  Josuha Fontanez    MRN: [id number] DOB: 1960-06-08  04/29/2019  Mr. Cassada was observed post Covid-19 immunization for 15 minutes without incident. He was provided with Vaccine Information Sheet and instruction to access the V-Safe system.   Mr. Hyams was instructed to call 911 with any severe reactions post vaccine: Marland Kitchen Difficulty breathing  . Swelling of face and throat  . A fast heartbeat  . A bad rash all over body  . Dizziness and weakness   Immunizations Administered    Name Date Dose VIS Date Route   Pfizer COVID-19 Vaccine 04/29/2019  1:36 PM 0.3 mL 01/11/2019 Intramuscular   Manufacturer: [employer], [employer]   Lot: [id number]   NDC: [address]-3

## 2019-05-28 ENCOUNTER — Ambulatory Visit: Payer: 59 | Attending: Internal Medicine

## 2019-05-28 DIAGNOSIS — Z23 Encounter for immunization: Secondary | ICD-10-CM

## 2019-05-28 NOTE — Progress Notes (Signed)
   Covid-19 Vaccination Clinic  Name:  Ellery Meroney    MRN: [id number] DOB: 1960/09/13  05/28/2019  Mr. Hedding was observed post Covid-19 immunization for 15 minutes without incident. He was provided with Vaccine Information Sheet and instruction to access the V-Safe system.   Mr. Fennell was instructed to call 911 with any severe reactions post vaccine: Marland Kitchen Difficulty breathing  . Swelling of face and throat  . A fast heartbeat  . A bad rash all over body  . Dizziness and weakness   Immunizations Administered    Name Date Dose VIS Date Route   Pfizer COVID-19 Vaccine 05/28/2019 12:43 PM 0.3 mL 03/27/2018 Intramuscular   Manufacturer: [employer], [employer]   Lot: [id number]   NDC: [address]-4
